# Patient Record
Sex: Female | Born: 1980 | Race: Black or African American | Hispanic: No | Marital: Single | Smoking: Never smoker
Health system: Southern US, Community
[De-identification: ages and names within clinical notes are randomized; demographics above are authoritative.]

---

## 2013-04-26 DIAGNOSIS — F3289 Other specified depressive episodes: Secondary | ICD-10-CM | POA: Diagnosis not present

## 2013-04-26 DIAGNOSIS — R45851 Suicidal ideations: Secondary | ICD-10-CM | POA: Diagnosis not present

## 2013-04-26 DIAGNOSIS — F329 Major depressive disorder, single episode, unspecified: Secondary | ICD-10-CM | POA: Diagnosis not present

## 2013-04-28 DIAGNOSIS — Z91199 Patient's noncompliance with other medical treatment and regimen due to unspecified reason: Secondary | ICD-10-CM | POA: Diagnosis not present

## 2013-04-28 DIAGNOSIS — F200 Paranoid schizophrenia: Secondary | ICD-10-CM | POA: Diagnosis present

## 2013-04-28 DIAGNOSIS — F29 Unspecified psychosis not due to a substance or known physiological condition: Secondary | ICD-10-CM | POA: Diagnosis not present

## 2013-04-28 DIAGNOSIS — K089 Disorder of teeth and supporting structures, unspecified: Secondary | ICD-10-CM | POA: Diagnosis present

## 2013-04-28 DIAGNOSIS — R45851 Suicidal ideations: Secondary | ICD-10-CM | POA: Diagnosis not present

## 2013-04-28 DIAGNOSIS — Z9119 Patient's noncompliance with other medical treatment and regimen: Secondary | ICD-10-CM | POA: Diagnosis not present

## 2013-04-28 DIAGNOSIS — F329 Major depressive disorder, single episode, unspecified: Secondary | ICD-10-CM | POA: Diagnosis not present

## 2013-04-28 DIAGNOSIS — M549 Dorsalgia, unspecified: Secondary | ICD-10-CM | POA: Diagnosis not present

## 2013-06-10 DIAGNOSIS — Z0000 Encounter for general adult medical examination without abnormal findings: Secondary | ICD-10-CM | POA: Diagnosis not present

## 2013-06-10 DIAGNOSIS — Z5320 Procedure and treatment not carried out because of patient's decision for unspecified reasons: Secondary | ICD-10-CM | POA: Diagnosis not present

## 2013-07-11 DIAGNOSIS — F29 Unspecified psychosis not due to a substance or known physiological condition: Secondary | ICD-10-CM | POA: Diagnosis not present

## 2013-07-11 DIAGNOSIS — F489 Nonpsychotic mental disorder, unspecified: Secondary | ICD-10-CM | POA: Diagnosis not present

## 2013-07-12 DIAGNOSIS — Z5900 Homelessness unspecified: Secondary | ICD-10-CM | POA: Diagnosis not present

## 2013-07-12 DIAGNOSIS — F29 Unspecified psychosis not due to a substance or known physiological condition: Secondary | ICD-10-CM | POA: Diagnosis not present

## 2013-07-12 DIAGNOSIS — F209 Schizophrenia, unspecified: Secondary | ICD-10-CM | POA: Diagnosis not present

## 2013-07-12 DIAGNOSIS — F259 Schizoaffective disorder, unspecified: Secondary | ICD-10-CM | POA: Diagnosis not present

## 2013-07-12 DIAGNOSIS — Z91199 Patient's noncompliance with other medical treatment and regimen due to unspecified reason: Secondary | ICD-10-CM | POA: Diagnosis not present

## 2013-07-12 DIAGNOSIS — F609 Personality disorder, unspecified: Secondary | ICD-10-CM | POA: Diagnosis not present

## 2013-07-12 DIAGNOSIS — F200 Paranoid schizophrenia: Secondary | ICD-10-CM | POA: Diagnosis not present

## 2013-07-12 DIAGNOSIS — Z9119 Patient's noncompliance with other medical treatment and regimen: Secondary | ICD-10-CM | POA: Diagnosis not present

## 2013-10-30 DIAGNOSIS — R109 Unspecified abdominal pain: Secondary | ICD-10-CM | POA: Diagnosis not present

## 2014-02-10 DIAGNOSIS — F23 Brief psychotic disorder: Secondary | ICD-10-CM | POA: Diagnosis not present

## 2014-02-10 DIAGNOSIS — R7989 Other specified abnormal findings of blood chemistry: Secondary | ICD-10-CM | POA: Diagnosis not present

## 2014-02-12 DIAGNOSIS — F29 Unspecified psychosis not due to a substance or known physiological condition: Secondary | ICD-10-CM | POA: Diagnosis not present

## 2014-02-13 DIAGNOSIS — F29 Unspecified psychosis not due to a substance or known physiological condition: Secondary | ICD-10-CM | POA: Diagnosis not present

## 2014-02-14 DIAGNOSIS — F29 Unspecified psychosis not due to a substance or known physiological condition: Secondary | ICD-10-CM | POA: Diagnosis not present

## 2014-03-27 DIAGNOSIS — N760 Acute vaginitis: Secondary | ICD-10-CM | POA: Diagnosis not present

## 2014-03-27 DIAGNOSIS — Z720 Tobacco use: Secondary | ICD-10-CM | POA: Diagnosis not present

## 2014-03-27 DIAGNOSIS — N739 Female pelvic inflammatory disease, unspecified: Secondary | ICD-10-CM | POA: Diagnosis not present

## 2014-03-27 DIAGNOSIS — A599 Trichomoniasis, unspecified: Secondary | ICD-10-CM | POA: Diagnosis not present

## 2014-03-27 DIAGNOSIS — A5901 Trichomonal vulvovaginitis: Secondary | ICD-10-CM | POA: Diagnosis not present

## 2014-03-27 DIAGNOSIS — F209 Schizophrenia, unspecified: Secondary | ICD-10-CM | POA: Diagnosis not present

## 2014-03-27 DIAGNOSIS — B9689 Other specified bacterial agents as the cause of diseases classified elsewhere: Secondary | ICD-10-CM | POA: Diagnosis not present

## 2014-07-11 DIAGNOSIS — A64 Unspecified sexually transmitted disease: Secondary | ICD-10-CM | POA: Diagnosis not present

## 2014-07-11 DIAGNOSIS — A599 Trichomoniasis, unspecified: Secondary | ICD-10-CM | POA: Diagnosis not present

## 2014-07-11 DIAGNOSIS — F209 Schizophrenia, unspecified: Secondary | ICD-10-CM | POA: Diagnosis not present

## 2014-12-24 DIAGNOSIS — Z590 Homelessness: Secondary | ICD-10-CM | POA: Diagnosis not present

## 2014-12-24 DIAGNOSIS — F17210 Nicotine dependence, cigarettes, uncomplicated: Secondary | ICD-10-CM | POA: Diagnosis not present

## 2014-12-24 DIAGNOSIS — N390 Urinary tract infection, site not specified: Secondary | ICD-10-CM | POA: Diagnosis not present

## 2015-01-29 DIAGNOSIS — S42032A Displaced fracture of lateral end of left clavicle, initial encounter for closed fracture: Secondary | ICD-10-CM | POA: Diagnosis not present

## 2015-01-29 DIAGNOSIS — A549 Gonococcal infection, unspecified: Secondary | ICD-10-CM | POA: Diagnosis not present

## 2015-01-29 DIAGNOSIS — Z590 Homelessness: Secondary | ICD-10-CM | POA: Diagnosis not present

## 2015-01-29 DIAGNOSIS — F17200 Nicotine dependence, unspecified, uncomplicated: Secondary | ICD-10-CM | POA: Diagnosis not present

## 2015-04-07 DIAGNOSIS — F068 Other specified mental disorders due to known physiological condition: Secondary | ICD-10-CM | POA: Diagnosis not present

## 2015-05-20 DIAGNOSIS — Z30013 Encounter for initial prescription of injectable contraceptive: Secondary | ICD-10-CM | POA: Diagnosis not present

## 2015-06-21 DIAGNOSIS — R69 Illness, unspecified: Secondary | ICD-10-CM | POA: Diagnosis not present

## 2015-06-21 DIAGNOSIS — F329 Major depressive disorder, single episode, unspecified: Secondary | ICD-10-CM | POA: Diagnosis not present

## 2015-06-21 DIAGNOSIS — M25512 Pain in left shoulder: Secondary | ICD-10-CM | POA: Diagnosis not present

## 2015-06-21 DIAGNOSIS — S42032A Displaced fracture of lateral end of left clavicle, initial encounter for closed fracture: Secondary | ICD-10-CM | POA: Diagnosis not present

## 2015-06-21 DIAGNOSIS — Z590 Homelessness: Secondary | ICD-10-CM | POA: Diagnosis not present

## 2015-06-21 DIAGNOSIS — F17210 Nicotine dependence, cigarettes, uncomplicated: Secondary | ICD-10-CM | POA: Diagnosis not present

## 2015-06-21 DIAGNOSIS — Z91410 Personal history of adult physical and sexual abuse: Secondary | ICD-10-CM | POA: Diagnosis not present

## 2015-06-21 DIAGNOSIS — T7691XA Unspecified adult maltreatment, suspected, initial encounter: Secondary | ICD-10-CM | POA: Diagnosis not present

## 2015-06-21 DIAGNOSIS — S42002S Fracture of unspecified part of left clavicle, sequela: Secondary | ICD-10-CM | POA: Diagnosis not present

## 2015-06-21 DIAGNOSIS — F28 Other psychotic disorder not due to a substance or known physiological condition: Secondary | ICD-10-CM | POA: Diagnosis not present

## 2015-06-25 DIAGNOSIS — S42025A Nondisplaced fracture of shaft of left clavicle, initial encounter for closed fracture: Secondary | ICD-10-CM | POA: Diagnosis not present

## 2015-06-27 DIAGNOSIS — S42002A Fracture of unspecified part of left clavicle, initial encounter for closed fracture: Secondary | ICD-10-CM | POA: Diagnosis not present

## 2015-06-27 DIAGNOSIS — Z3202 Encounter for pregnancy test, result negative: Secondary | ICD-10-CM | POA: Diagnosis not present

## 2015-06-27 DIAGNOSIS — S90811A Abrasion, right foot, initial encounter: Secondary | ICD-10-CM | POA: Diagnosis not present

## 2015-06-27 DIAGNOSIS — M25572 Pain in left ankle and joints of left foot: Secondary | ICD-10-CM | POA: Diagnosis not present

## 2015-06-27 DIAGNOSIS — M25571 Pain in right ankle and joints of right foot: Secondary | ICD-10-CM | POA: Diagnosis not present

## 2015-06-27 DIAGNOSIS — F17210 Nicotine dependence, cigarettes, uncomplicated: Secondary | ICD-10-CM | POA: Diagnosis not present

## 2015-06-27 DIAGNOSIS — G8911 Acute pain due to trauma: Secondary | ICD-10-CM | POA: Diagnosis not present

## 2015-06-27 DIAGNOSIS — T148 Other injury of unspecified body region: Secondary | ICD-10-CM | POA: Diagnosis not present

## 2015-06-27 DIAGNOSIS — M25562 Pain in left knee: Secondary | ICD-10-CM | POA: Diagnosis not present

## 2015-06-27 DIAGNOSIS — R918 Other nonspecific abnormal finding of lung field: Secondary | ICD-10-CM | POA: Diagnosis not present

## 2015-06-27 DIAGNOSIS — S99921A Unspecified injury of right foot, initial encounter: Secondary | ICD-10-CM | POA: Diagnosis not present

## 2015-06-27 DIAGNOSIS — S80211A Abrasion, right knee, initial encounter: Secondary | ICD-10-CM | POA: Diagnosis not present

## 2015-06-27 DIAGNOSIS — Z041 Encounter for examination and observation following transport accident: Secondary | ICD-10-CM | POA: Diagnosis not present

## 2015-06-27 DIAGNOSIS — M25561 Pain in right knee: Secondary | ICD-10-CM | POA: Diagnosis not present

## 2015-06-27 DIAGNOSIS — M25551 Pain in right hip: Secondary | ICD-10-CM | POA: Diagnosis not present

## 2015-06-27 DIAGNOSIS — M79672 Pain in left foot: Secondary | ICD-10-CM | POA: Diagnosis not present

## 2015-06-27 DIAGNOSIS — M79671 Pain in right foot: Secondary | ICD-10-CM | POA: Diagnosis not present

## 2015-06-28 DIAGNOSIS — R109 Unspecified abdominal pain: Secondary | ICD-10-CM | POA: Diagnosis not present

## 2015-06-28 DIAGNOSIS — F329 Major depressive disorder, single episode, unspecified: Secondary | ICD-10-CM | POA: Diagnosis not present

## 2015-06-28 DIAGNOSIS — F17210 Nicotine dependence, cigarettes, uncomplicated: Secondary | ICD-10-CM | POA: Diagnosis not present

## 2015-06-28 DIAGNOSIS — R1084 Generalized abdominal pain: Secondary | ICD-10-CM | POA: Diagnosis not present

## 2015-06-28 DIAGNOSIS — M7989 Other specified soft tissue disorders: Secondary | ICD-10-CM | POA: Diagnosis not present

## 2015-06-28 DIAGNOSIS — R071 Chest pain on breathing: Secondary | ICD-10-CM | POA: Diagnosis not present

## 2015-06-30 DIAGNOSIS — Z041 Encounter for examination and observation following transport accident: Secondary | ICD-10-CM | POA: Diagnosis not present

## 2015-06-30 DIAGNOSIS — M791 Myalgia: Secondary | ICD-10-CM | POA: Diagnosis not present

## 2015-06-30 DIAGNOSIS — R52 Pain, unspecified: Secondary | ICD-10-CM | POA: Diagnosis not present

## 2015-06-30 DIAGNOSIS — F17210 Nicotine dependence, cigarettes, uncomplicated: Secondary | ICD-10-CM | POA: Diagnosis not present

## 2015-07-04 DIAGNOSIS — Z8659 Personal history of other mental and behavioral disorders: Secondary | ICD-10-CM | POA: Diagnosis not present

## 2015-07-04 DIAGNOSIS — M79604 Pain in right leg: Secondary | ICD-10-CM | POA: Diagnosis not present

## 2015-07-04 DIAGNOSIS — M79605 Pain in left leg: Secondary | ICD-10-CM | POA: Diagnosis not present

## 2015-07-04 DIAGNOSIS — F17210 Nicotine dependence, cigarettes, uncomplicated: Secondary | ICD-10-CM | POA: Diagnosis not present

## 2015-09-23 DIAGNOSIS — F411 Generalized anxiety disorder: Secondary | ICD-10-CM | POA: Diagnosis not present

## 2015-09-23 DIAGNOSIS — F209 Schizophrenia, unspecified: Secondary | ICD-10-CM | POA: Diagnosis not present

## 2015-09-23 DIAGNOSIS — F339 Major depressive disorder, recurrent, unspecified: Secondary | ICD-10-CM | POA: Diagnosis not present

## 2015-09-23 DIAGNOSIS — M545 Low back pain: Secondary | ICD-10-CM | POA: Diagnosis not present

## 2015-09-23 DIAGNOSIS — G8929 Other chronic pain: Secondary | ICD-10-CM | POA: Diagnosis not present

## 2015-09-23 DIAGNOSIS — G4700 Insomnia, unspecified: Secondary | ICD-10-CM | POA: Diagnosis not present

## 2015-09-23 DIAGNOSIS — Z3042 Encounter for surveillance of injectable contraceptive: Secondary | ICD-10-CM | POA: Diagnosis not present

## 2015-09-23 DIAGNOSIS — M62830 Muscle spasm of back: Secondary | ICD-10-CM | POA: Diagnosis not present

## 2015-10-10 DIAGNOSIS — M545 Low back pain: Secondary | ICD-10-CM | POA: Diagnosis not present

## 2015-10-10 DIAGNOSIS — M79606 Pain in leg, unspecified: Secondary | ICD-10-CM | POA: Diagnosis not present

## 2015-10-10 DIAGNOSIS — Z79899 Other long term (current) drug therapy: Secondary | ICD-10-CM | POA: Diagnosis not present

## 2015-10-10 DIAGNOSIS — M25512 Pain in left shoulder: Secondary | ICD-10-CM | POA: Diagnosis not present

## 2015-10-10 DIAGNOSIS — R42 Dizziness and giddiness: Secondary | ICD-10-CM | POA: Diagnosis not present

## 2015-10-10 DIAGNOSIS — M542 Cervicalgia: Secondary | ICD-10-CM | POA: Diagnosis not present

## 2015-10-10 DIAGNOSIS — G8929 Other chronic pain: Secondary | ICD-10-CM | POA: Diagnosis not present

## 2015-10-10 DIAGNOSIS — M7989 Other specified soft tissue disorders: Secondary | ICD-10-CM | POA: Diagnosis not present

## 2016-02-13 DIAGNOSIS — Z3040 Encounter for surveillance of contraceptives, unspecified: Secondary | ICD-10-CM | POA: Diagnosis not present

## 2016-02-13 DIAGNOSIS — M545 Low back pain: Secondary | ICD-10-CM | POA: Diagnosis not present

## 2016-02-13 DIAGNOSIS — M62830 Muscle spasm of back: Secondary | ICD-10-CM | POA: Diagnosis not present

## 2016-02-13 DIAGNOSIS — G8929 Other chronic pain: Secondary | ICD-10-CM | POA: Diagnosis not present

## 2016-02-13 DIAGNOSIS — G4700 Insomnia, unspecified: Secondary | ICD-10-CM | POA: Diagnosis not present

## 2016-02-13 DIAGNOSIS — F339 Major depressive disorder, recurrent, unspecified: Secondary | ICD-10-CM | POA: Diagnosis not present

## 2016-02-13 DIAGNOSIS — F411 Generalized anxiety disorder: Secondary | ICD-10-CM | POA: Diagnosis not present

## 2016-02-13 DIAGNOSIS — F209 Schizophrenia, unspecified: Secondary | ICD-10-CM | POA: Diagnosis not present

## 2016-06-21 ENCOUNTER — Emergency Department (HOSPITAL_COMMUNITY)
Admission: EM | Admit: 2016-06-21 | Discharge: 2016-06-21 | Disposition: A | Discharge status: Other Acute Inpatient Hospital | Payer: Medicaid Other | Attending: Emergency Medicine | Admitting: Emergency Medicine

## 2016-06-21 ENCOUNTER — Inpatient Hospital Stay (HOSPITAL_COMMUNITY)
Admission: AD | Admit: 2016-06-21 | Discharge: 2016-06-24 | DRG: 885 | Disposition: A | Discharge status: Home / Self Care | Payer: Medicaid Other | Source: Intra-hospital | Attending: Psychiatry | Admitting: Psychiatry

## 2016-06-21 ENCOUNTER — Encounter (HOSPITAL_COMMUNITY): Payer: Self-pay | Admitting: *Deleted

## 2016-06-21 ENCOUNTER — Encounter (HOSPITAL_COMMUNITY): Payer: Self-pay

## 2016-06-21 ENCOUNTER — Emergency Department (HOSPITAL_COMMUNITY): Payer: Medicaid Other

## 2016-06-21 DIAGNOSIS — R0781 Pleurodynia: Secondary | ICD-10-CM | POA: Insufficient documentation

## 2016-06-21 DIAGNOSIS — F1094 Alcohol use, unspecified with alcohol-induced mood disorder: Secondary | ICD-10-CM

## 2016-06-21 DIAGNOSIS — F4310 Post-traumatic stress disorder, unspecified: Secondary | ICD-10-CM | POA: Diagnosis present

## 2016-06-21 DIAGNOSIS — F1014 Alcohol abuse with alcohol-induced mood disorder: Secondary | ICD-10-CM | POA: Insufficient documentation

## 2016-06-21 DIAGNOSIS — Z79899 Other long term (current) drug therapy: Secondary | ICD-10-CM

## 2016-06-21 DIAGNOSIS — T1490XA Injury, unspecified, initial encounter: Secondary | ICD-10-CM | POA: Diagnosis present

## 2016-06-21 DIAGNOSIS — Y999 Unspecified external cause status: Secondary | ICD-10-CM | POA: Insufficient documentation

## 2016-06-21 DIAGNOSIS — Y906 Blood alcohol level of 120-199 mg/100 ml: Secondary | ICD-10-CM | POA: Diagnosis present

## 2016-06-21 DIAGNOSIS — F1410 Cocaine abuse, uncomplicated: Secondary | ICD-10-CM | POA: Diagnosis present

## 2016-06-21 DIAGNOSIS — R45851 Suicidal ideations: Secondary | ICD-10-CM | POA: Diagnosis present

## 2016-06-21 DIAGNOSIS — Y939 Activity, unspecified: Secondary | ICD-10-CM | POA: Insufficient documentation

## 2016-06-21 DIAGNOSIS — F17200 Nicotine dependence, unspecified, uncomplicated: Secondary | ICD-10-CM | POA: Diagnosis present

## 2016-06-21 DIAGNOSIS — J189 Pneumonia, unspecified organism: Secondary | ICD-10-CM | POA: Diagnosis present

## 2016-06-21 DIAGNOSIS — F314 Bipolar disorder, current episode depressed, severe, without psychotic features: Secondary | ICD-10-CM | POA: Diagnosis not present

## 2016-06-21 DIAGNOSIS — R45850 Homicidal ideations: Secondary | ICD-10-CM | POA: Diagnosis present

## 2016-06-21 DIAGNOSIS — Z23 Encounter for immunization: Secondary | ICD-10-CM

## 2016-06-21 DIAGNOSIS — I10 Essential (primary) hypertension: Secondary | ICD-10-CM | POA: Diagnosis present

## 2016-06-21 DIAGNOSIS — Y929 Unspecified place or not applicable: Secondary | ICD-10-CM | POA: Insufficient documentation

## 2016-06-21 DIAGNOSIS — R079 Chest pain, unspecified: Secondary | ICD-10-CM | POA: Diagnosis not present

## 2016-06-21 DIAGNOSIS — F332 Major depressive disorder, recurrent severe without psychotic features: Principal | ICD-10-CM | POA: Diagnosis present

## 2016-06-21 DIAGNOSIS — Z5900 Homelessness unspecified: Secondary | ICD-10-CM

## 2016-06-21 DIAGNOSIS — Z5181 Encounter for therapeutic drug level monitoring: Secondary | ICD-10-CM | POA: Diagnosis not present

## 2016-06-21 DIAGNOSIS — G4700 Insomnia, unspecified: Secondary | ICD-10-CM | POA: Diagnosis present

## 2016-06-21 DIAGNOSIS — F1020 Alcohol dependence, uncomplicated: Secondary | ICD-10-CM | POA: Diagnosis present

## 2016-06-21 LAB — COMPREHENSIVE METABOLIC PANEL
ALBUMIN: 4.3 g/dL (ref 3.5–5.0)
ALK PHOS: 85 U/L (ref 38–126)
ALT: 22 U/L (ref 14–54)
AST: 41 U/L (ref 15–41)
Anion gap: 12 (ref 5–15)
BILIRUBIN TOTAL: 0.4 mg/dL (ref 0.3–1.2)
BUN: 7 mg/dL (ref 6–20)
CALCIUM: 9.5 mg/dL (ref 8.9–10.3)
CO2: 26 mmol/L (ref 22–32)
Chloride: 102 mmol/L (ref 101–111)
Creatinine, Ser: 0.96 mg/dL (ref 0.44–1.00)
GFR calc Af Amer: >60 mL/min (ref >60)
GFR calc non Af Amer: >60 mL/min (ref >60)
GLUCOSE: 81 mg/dL (ref 65–99)
Potassium: 4.3 mmol/L (ref 3.5–5.1)
Sodium: 140 mmol/L (ref 135–145)
TOTAL PROTEIN: 7.7 g/dL (ref 6.5–8.1)

## 2016-06-21 LAB — CBC
HCT: 37.7 % (ref 36.0–46.0)
HEMOGLOBIN: 12.5 g/dL (ref 12.0–15.0)
MCH: 26.8 pg (ref 26.0–34.0)
MCHC: 33.2 g/dL (ref 30.0–36.0)
MCV: 80.7 fL (ref 78.0–100.0)
Platelets: 310 10*3/uL (ref 150–400)
RBC: 4.67 MIL/uL (ref 3.87–5.11)
RDW: 14.4 % (ref 11.5–15.5)
WBC: 11.5 10*3/uL — ABNORMAL HIGH (ref 4.0–10.5)

## 2016-06-21 LAB — RAPID URINE DRUG SCREEN, HOSP PERFORMED
Amphetamines: NOT DETECTED
BARBITURATES: NOT DETECTED
Benzodiazepines: NOT DETECTED
Cocaine: POSITIVE — AB
Opiates: NOT DETECTED
Tetrahydrocannabinol: NOT DETECTED

## 2016-06-21 LAB — ETHANOL: Alcohol, Ethyl (B): 137 mg/dL — ABNORMAL HIGH (ref <5)

## 2016-06-21 LAB — PREGNANCY, URINE: PREG TEST UR: NEGATIVE

## 2016-06-21 MED ORDER — IBUPROFEN 800 MG PO TABS
800.0000 mg | ORAL_TABLET | Freq: Three times a day (TID) | ORAL | Status: DC | PRN
  Administered 2016-06-22 – 2016-06-24 (×5): 800 mg via ORAL
  Filled 2016-06-21 (×5): qty 1

## 2016-06-21 MED ORDER — LORAZEPAM 1 MG PO TABS
0.0000 mg | ORAL_TABLET | Freq: Two times a day (BID) | ORAL | Status: DC
Start: 2016-06-23 — End: 2016-06-21

## 2016-06-21 MED ORDER — LORAZEPAM 1 MG PO TABS
1.0000 mg | ORAL_TABLET | Freq: Four times a day (QID) | ORAL | Status: AC
  Administered 2016-06-21 – 2016-06-22 (×4): 1 mg via ORAL
  Filled 2016-06-21 (×3): qty 1

## 2016-06-21 MED ORDER — LEVOFLOXACIN 250 MG PO TABS
750.0000 mg | ORAL_TABLET | Freq: Every day | ORAL | Status: DC
  Administered 2016-06-22 – 2016-06-24 (×3): 750 mg via ORAL
  Filled 2016-06-21 (×4): qty 1

## 2016-06-21 MED ORDER — IBUPROFEN 400 MG PO TABS
800.0000 mg | ORAL_TABLET | Freq: Three times a day (TID) | ORAL | Status: DC | PRN
  Administered 2016-06-21 (×2): 800 mg via ORAL
  Filled 2016-06-21 (×2): qty 1

## 2016-06-21 MED ORDER — LORAZEPAM 1 MG PO TABS
1.0000 mg | ORAL_TABLET | Freq: Two times a day (BID) | ORAL | Status: DC
  Administered 2016-06-24: 1 mg via ORAL
  Filled 2016-06-21: qty 1

## 2016-06-21 MED ORDER — CITALOPRAM HYDROBROMIDE 20 MG PO TABS
ORAL_TABLET | ORAL | Status: AC
  Administered 2016-06-21: 20 mg via ORAL
  Filled 2016-06-21: qty 1

## 2016-06-21 MED ORDER — LORAZEPAM 1 MG PO TABS
1.0000 mg | ORAL_TABLET | Freq: Three times a day (TID) | ORAL | Status: AC
  Administered 2016-06-22 – 2016-06-23 (×3): 1 mg via ORAL
  Filled 2016-06-21 (×3): qty 1

## 2016-06-21 MED ORDER — CITALOPRAM HYDROBROMIDE 20 MG PO TABS
20.0000 mg | ORAL_TABLET | Freq: Every day | ORAL | Status: DC
  Administered 2016-06-21 – 2016-06-24 (×4): 20 mg via ORAL
  Filled 2016-06-21 (×7): qty 1

## 2016-06-21 MED ORDER — QUETIAPINE FUMARATE 50 MG PO TABS
50.0000 mg | ORAL_TABLET | Freq: Every day | ORAL | Status: DC
  Administered 2016-06-21 – 2016-06-23 (×3): 50 mg via ORAL
  Filled 2016-06-21 (×7): qty 1

## 2016-06-21 MED ORDER — VITAMIN B-1 100 MG PO TABS
ORAL_TABLET | ORAL | Status: AC
  Administered 2016-06-21: 100 mg via ORAL
  Filled 2016-06-21: qty 1

## 2016-06-21 MED ORDER — LEVOFLOXACIN 750 MG PO TABS
750.0000 mg | ORAL_TABLET | Freq: Every day | ORAL | Status: DC
  Administered 2016-06-21: 750 mg via ORAL
  Filled 2016-06-21: qty 1

## 2016-06-21 MED ORDER — ADULT MULTIVITAMIN W/MINERALS CH
1.0000 | ORAL_TABLET | Freq: Every day | ORAL | Status: DC
  Administered 2016-06-21 – 2016-06-24 (×4): 1 via ORAL
  Filled 2016-06-21 (×7): qty 1

## 2016-06-21 MED ORDER — VITAMIN B-1 100 MG PO TABS
100.0000 mg | ORAL_TABLET | Freq: Every day | ORAL | Status: DC
  Administered 2016-06-21 – 2016-06-24 (×4): 100 mg via ORAL
  Filled 2016-06-21 (×6): qty 1

## 2016-06-21 MED ORDER — ALUM & MAG HYDROXIDE-SIMETH 200-200-20 MG/5ML PO SUSP: 30.0000 mL | ORAL | Status: DC | PRN

## 2016-06-21 MED ORDER — MAGNESIUM HYDROXIDE 400 MG/5ML PO SUSP: 30.0000 mL | Freq: Every day | ORAL | Status: DC | PRN

## 2016-06-21 MED ORDER — ADULT MULTIVITAMIN W/MINERALS CH
ORAL_TABLET | ORAL | Status: AC
  Administered 2016-06-21: 1 via ORAL
  Filled 2016-06-21: qty 1

## 2016-06-21 MED ORDER — HYDROXYZINE HCL 50 MG PO TABS
50.0000 mg | ORAL_TABLET | Freq: Three times a day (TID) | ORAL | Status: DC | PRN
  Administered 2016-06-23 – 2016-06-24 (×2): 50 mg via ORAL
  Filled 2016-06-21 (×2): qty 1

## 2016-06-21 MED ORDER — ONDANSETRON 4 MG PO TBDP: 4.0000 mg | ORAL_TABLET | Freq: Four times a day (QID) | ORAL | Status: DC | PRN

## 2016-06-21 MED ORDER — LORAZEPAM 1 MG PO TABS
ORAL_TABLET | ORAL | Status: AC
  Administered 2016-06-21: 1 mg via ORAL
  Filled 2016-06-21: qty 1

## 2016-06-21 MED ORDER — LORAZEPAM 1 MG PO TABS: 0.0000 mg | ORAL_TABLET | Freq: Four times a day (QID) | ORAL | Status: DC

## 2016-06-21 MED ORDER — LORAZEPAM 1 MG PO TABS: 1.0000 mg | ORAL_TABLET | Freq: Every day | ORAL | Status: DC

## 2016-06-21 MED ORDER — ACETAMINOPHEN 325 MG PO TABS
650.0000 mg | ORAL_TABLET | Freq: Four times a day (QID) | ORAL | Status: DC | PRN
  Administered 2016-06-22 – 2016-06-23 (×2): 650 mg via ORAL
  Filled 2016-06-21 (×2): qty 2

## 2016-06-21 MED ORDER — LORAZEPAM 1 MG PO TABS: 1.0000 mg | ORAL_TABLET | Freq: Four times a day (QID) | ORAL | Status: DC | PRN

## 2016-06-21 MED ORDER — TRAZODONE HCL 50 MG PO TABS
50.0000 mg | ORAL_TABLET | Freq: Every day | ORAL | Status: DC
  Administered 2016-06-21 – 2016-06-23 (×3): 50 mg via ORAL
  Filled 2016-06-21 (×6): qty 1

## 2016-06-21 MED ORDER — LOPERAMIDE HCL 2 MG PO CAPS: 2.0000 mg | ORAL_CAPSULE | ORAL | Status: DC | PRN

## 2016-06-21 MED ORDER — THIAMINE HCL 100 MG/ML IJ SOLN: 100.0000 mg | Freq: Once | INTRAMUSCULAR | Status: DC

## 2016-06-21 NOTE — ED Notes (Signed)
Lunch tray ordered 

## 2016-06-21 NOTE — ED Notes (Signed)
Report and pelum for transport called.

## 2016-06-21 NOTE — Progress Notes (Signed)
D.  Pt pleasant on approach, new admission to unit.  Pt did not get up for evening AA group tonight.  Pt observed interacting appropriately with peers on the unit.  Pt denies SI/HI/hallucinations at this time.  A.  Support and encouragement offered, medication given as ordered  R.  Pt remains safe on the unit.  Will continue to monitor.

## 2016-06-21 NOTE — ED Notes (Signed)
Patient transported to X-ray 
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
## 2016-06-21 NOTE — ED Notes (Signed)
Pt changed into purple scrubs and given saltines and ginger ale. Pt's belongings taken to nurses station.

## 2016-06-21 NOTE — ED Notes (Signed)
Pt given breakfast tray

## 2016-06-21 NOTE — ED Notes (Signed)
TTS at bedside. 

## 2016-06-21 NOTE — ED Notes (Signed)
Pt in gown, and given warm blankets. Pt asking this EMT about the Psychiatric process and where she could be transferred to.

## 2016-06-21 NOTE — Progress Notes (Signed)
Patient ID: Barbara Werner, female   DOB: 05-19-1980, 36 y.o.   MRN: [id number] Pt did not attended evening AA group.

## 2016-06-21 NOTE — Tx Team (Signed)
Initial Treatment Plan 06/21/2016 5:06 PM Barbara LienSylvia Fogarty AOZ:[id number]    PATIENT STRESSORS: Financial difficulties Marital or family conflict   PATIENT STRENGTHS: Average or above average intelligence General fund of knowledge   PATIENT IDENTIFIED PROBLEMS: Depression    "I just want my kids back"    "I need to get my own place"    SI         DISCHARGE CRITERIA:  Improved stabilization in mood, thinking, and/or behavior Need for constant or close observation no longer present  PRELIMINARY DISCHARGE PLAN: Return to previous living arrangement  PATIENT/FAMILY INVOLVEMENT: This treatment plan has been presented to and reviewed with the patient, Barbara Werner, and/or family member.  The patient and family have been given the opportunity to ask questions and make suggestions.  Buford DresserForrest, Tyrika Newman Shanta, RN 06/21/2016, 5:06 PM

## 2016-06-21 NOTE — ED Notes (Signed)
Pt wanded by security. 

## 2016-06-21 NOTE — ED Provider Notes (Signed)
Questionable bilateral pneumonia on x-ray. Mild elevation of white blood cell count. Patient states she has had a productive cough recently. Patient denies any IV drug abuse. Given dose of Levaquin in the emergency department. We'll need to continue and have repeat chest x-ray. Patient is medically cleared for psychiatric transfer. Vital signs remain stable.   Loren Raceravid Meganne Rita, MD 06/21/16 1332

## 2016-06-21 NOTE — ED Notes (Signed)
Pt left with pellam to behavirial

## 2016-06-21 NOTE — ED Notes (Signed)
Pt did not need anything at this time  
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
## 2016-06-21 NOTE — ED Notes (Signed)
TTS being completed. 
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
## 2016-06-21 NOTE — Plan of Care (Signed)
Problem: Medication: Goal: Compliance with prescribed medication regimen will improve Outcome: Progressing Pt is compliant with medication regimen   

## 2016-06-21 NOTE — BH Assessment (Signed)
[facility]   06/21/16: Patient meets inpatient criteria per Arville Care[facility]ks NP and has been accepted to 300-2 at 14:30.

## 2016-06-22 ENCOUNTER — Encounter (HOSPITAL_COMMUNITY): Payer: Self-pay | Admitting: Psychiatry

## 2016-06-22 DIAGNOSIS — F314 Bipolar disorder, current episode depressed, severe, without psychotic features: Secondary | ICD-10-CM

## 2016-06-22 DIAGNOSIS — Z79899 Other long term (current) drug therapy: Secondary | ICD-10-CM

## 2016-06-22 MED ORDER — METOPROLOL SUCCINATE ER 25 MG PO TB24
25.0000 mg | ORAL_TABLET | Freq: Every day | ORAL | Status: DC
  Administered 2016-06-22 – 2016-06-24 (×3): 25 mg via ORAL
  Filled 2016-06-22 (×6): qty 1

## 2016-06-22 NOTE — Plan of Care (Signed)
Problem: Activity: Goal: Interest or engagement in activities will improve Outcome: Not Progressing Patient is tired from a late night admission.  She is isolative to her room.

## 2016-06-22 NOTE — Progress Notes (Signed)
Patient did not attend wrap up group. 

## 2016-06-22 NOTE — Tx Team (Signed)
Interdisciplinary Treatment and Diagnostic Plan Update  06/22/2016 Time of Session: 0930 Barbara Werner MRN: [id number]  Principal Diagnosis: Bipolar Disorder I Secondary Diagnoses: Active Problems:   Bipolar 1 disorder, depressed, severe (HCC)   Current Medications:  Current Facility-Administered Medications  Medication Dose Route Frequency Provider Last Rate Last Dose  . acetaminophen (TYLENOL) tablet 650 mg  650 mg Oral Q6H PRN Laveda Abbe, NP      . alum & mag hydroxide-simeth (MAALOX/MYLANTA) 200-200-20 MG/5ML suspension 30 mL  30 mL Oral Q4H PRN Laveda Abbe, NP      . citalopram (CELEXA) tablet 20 mg  20 mg Oral Daily Laveda Abbe, NP   20 mg at 06/22/16 4098  . hydrOXYzine (ATARAX/VISTARIL) tablet 50 mg  50 mg Oral TID PRN Laveda Abbe, NP      . ibuprofen (ADVIL,MOTRIN) tablet 800 mg  800 mg Oral Q8H PRN Laveda Abbe, NP      . levofloxacin [facility]) tablet 750 mg  750 mg Oral Daily Laveda Abbe, NP   750 mg at 06/22/16 1191  . loperamide (IMODIUM) capsule 2-4 mg  2-4 mg Oral PRN Laveda Abbe, NP      . LORazepam (ATIVAN) tablet 1 mg  1 mg Oral Q6H PRN Laveda Abbe, NP      . LORazepam (ATIVAN) tablet 1 mg  1 mg Oral QID Laveda Abbe, NP   1 mg at 06/22/16 4782   Followed by  . LORazepam (ATIVAN) tablet 1 mg  1 mg Oral TID Laveda Abbe, NP       Followed by  . [START ON 06/23/2016] LORazepam (ATIVAN) tablet 1 mg  1 mg Oral BID Laveda Abbe, NP       Followed by  . [START ON 06/25/2016] LORazepam (ATIVAN) tablet 1 mg  1 mg Oral Daily Laveda Abbe, NP      . magnesium hydroxide (MILK OF MAGNESIA) suspension 30 mL  30 mL Oral Daily PRN Laveda Abbe, NP      . metoprolol succinate (TOPROL-XL) 24 hr tablet 25 mg  25 mg Oral Daily Antonieta Pert, MD      . multivitamin with minerals tablet 1 tablet  1 tablet Oral Daily Laveda Abbe, NP   1 tablet at 06/22/16 [phone]  .  ondansetron (ZOFRAN-ODT) disintegrating tablet 4 mg  4 mg Oral Q6H PRN Laveda Abbe, NP      . QUEtiapine (SEROQUEL) tablet 50 mg  50 mg Oral QHS Laveda Abbe, NP   50 mg at 06/21/16 2128  . thiamine (B-1) injection 100 mg  100 mg Intramuscular Once Laveda Abbe, NP      . thiamine (VITAMIN B-1) tablet 100 mg  100 mg Oral Daily Laveda Abbe, NP   100 mg at 06/22/16 1308  . traZODone (DESYREL) tablet 50 mg  50 mg Oral QHS Laveda Abbe, NP   50 mg at 06/21/16 2128   PTA Medications: Prescriptions Prior to Admission  Medication Sig Dispense Refill Last Dose  . citalopram (CELEXA) 20 MG tablet Take 20 mg by mouth daily.   Unknown at Unknown time  . hydrOXYzine (VISTARIL) 50 MG capsule Take 50 mg by mouth 3 (three) times daily as needed for anxiety.   Unknown at Unknown time  . QUEtiapine (SEROQUEL) 50 MG tablet Take 50 mg by mouth at bedtime.   Unknown at Unknown time  . traZODone (DESYREL) 50 MG tablet  Take 50 mg by mouth at bedtime.   Unknown at Unknown time    Patient Stressors: Financial difficulties Marital or family conflict  Patient Strengths: Average or above average intelligence General fund of knowledge  Treatment Modalities: Medication Management, Group therapy, Case management,  1 to 1 session with clinician, Psychoeducation, Recreational therapy.   Physician Treatment Plan for Primary Diagnosis:  Bipolar Disorder I Long Term Goal(s): Improvement in symptoms so as ready for discharge Improvement in symptoms so as ready for discharge   Short Term Goals: Ability to identify changes in lifestyle to reduce recurrence of condition will improve Ability to verbalize feelings will improve Ability to disclose and discuss suicidal ideas Ability to demonstrate self-control will improve Ability to identify and develop effective coping behaviors will improve Ability to maintain clinical measurements within normal limits will improve Compliance  with prescribed medications will improve Ability to identify triggers associated with substance abuse/mental health issues will improve Ability to identify changes in lifestyle to reduce recurrence of condition will improve Ability to verbalize feelings will improve Ability to disclose and discuss suicidal ideas Ability to demonstrate self-control will improve Ability to identify and develop effective coping behaviors will improve Ability to maintain clinical measurements within normal limits will improve Compliance with prescribed medications will improve Ability to identify triggers associated with substance abuse/mental health issues will improve  Medication Management: Evaluate patient's response, side effects, and tolerance of medication regimen.  Therapeutic Interventions: 1 to 1 sessions, Unit Group sessions and Medication administration.  Evaluation of Outcomes: Progressing  Physician Treatment Plan for Secondary Diagnosis: Active Problems:   Bipolar 1 disorder, depressed, severe (HCC)  Long Term Goal(s): Improvement in symptoms so as ready for discharge Improvement in symptoms so as ready for discharge   Short Term Goals: Ability to identify changes in lifestyle to reduce recurrence of condition will improve Ability to verbalize feelings will improve Ability to disclose and discuss suicidal ideas Ability to demonstrate self-control will improve Ability to identify and develop effective coping behaviors will improve Ability to maintain clinical measurements within normal limits will improve Compliance with prescribed medications will improve Ability to identify triggers associated with substance abuse/mental health issues will improve Ability to identify changes in lifestyle to reduce recurrence of condition will improve Ability to verbalize feelings will improve Ability to disclose and discuss suicidal ideas Ability to demonstrate self-control will improve Ability to  identify and develop effective coping behaviors will improve Ability to maintain clinical measurements within normal limits will improve Compliance with prescribed medications will improve Ability to identify triggers associated with substance abuse/mental health issues will improve     Medication Management: Evaluate patient's response, side effects, and tolerance of medication regimen.  Therapeutic Interventions: 1 to 1 sessions, Unit Group sessions and Medication administration.  Evaluation of Outcomes: Progressing   RN Treatment Plan for Primary Diagnosis:  Bipolar Disorder I Long Term Goal(s): Knowledge of disease and therapeutic regimen to maintain health will improve  Short Term Goals: Ability to remain free from injury will improve, Ability to disclose and discuss suicidal ideas and Ability to identify and develop effective coping behaviors will improve  Medication Management: RN will administer medications as ordered by provider, will assess and evaluate patient's response and provide education to patient for prescribed medication. RN will report any adverse and/or side effects to prescribing provider.  Therapeutic Interventions: 1 on 1 counseling sessions, Psychoeducation, Medication administration, Evaluate responses to treatment, Monitor vital signs and CBGs as ordered, Perform/monitor CIWA, COWS, AIMS and Fall  Risk screenings as ordered, Perform wound care treatments as ordered.  Evaluation of Outcomes: Progressing   LCSW Treatment Plan for Primary Diagnosis: Bipolar Disorder I Long Term Goal(s): Safe transition to appropriate next level of care at discharge, Engage patient in therapeutic group addressing interpersonal concerns.  Short Term Goals: Engage patient in aftercare planning with referrals and resources, Facilitate patient progression through stages of change regarding substance use diagnoses and concerns and Identify triggers associated with mental health/substance  abuse issues  Therapeutic Interventions: Assess for all discharge needs, 1 to 1 time with Social worker, Explore available resources and support systems, Assess for adequacy in community support network, Educate family and significant other(s) on suicide prevention, Complete Psychosocial Assessment, Interpersonal group therapy.  Evaluation of Outcomes: Progressing   Progress in Treatment: Attending groups: Yes. Participating in groups: Yes. Taking medication as prescribed: Yes. Toleration medication: Yes. Family/Significant other contact made: No, will contact:  family member if patient consents Patient understands diagnosis: Yes. Discussing patient identified problems/goals with staff: No. Medical problems stabilized or resolved: No. Denies suicidal/homicidal ideation: Yes. Issues/concerns per patient self-inventory: No. Other: n/a  New problem(s) identified: No, Describe:  n/a  New Short Term/Long Term Goal(s): detox; medication stabilization; development of comprehensive mental wellness/sobriety plan.   Discharge Plan or Barriers: CSW assessing for appropriate referrals.   Reason for Continuation of Hospitalization: Depression Medication stabilization Withdrawal symptoms  Estimated Length of Stay: 3-5 days   Attendees: Patient: 06/22/2016 10:44 AM  Physician: Dr. Jola Babinskilary MD 06/22/2016 10:44 AM  Nursing: Purvis Kiltsaroline; Patrice RN 06/22/2016 10:44 AM  RN Care Manager: Onnie BoerJennifer Clark CM 06/22/2016 10:44 AM  Social Worker: Herbert SetaHeather Smart, LCSW; Donnelly StagerLynn Bryant LCSW 06/22/2016 10:44 AM  Recreational Therapist: x 06/22/2016 10:44 AM  Other: Gray BernhardtMay Augustin NP; Hillery Jacksanika Lewis NP 06/22/2016 10:44 AM  Other:  06/22/2016 10:44 AM  Other: 06/22/2016 10:44 AM    Scribe for Treatment Team: Ledell PeoplesHeather N Smart, LCSW 06/22/2016 10:44 AM
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
## 2016-06-22 NOTE — [facility] Suicide Risk Assessment (Signed)
[facility]   Nursing information obtained from:  Patient Demographic factors:  Low socioeconomic status Current Mental Status:  Suicidal ideation indicated by patient Loss Factors:  Financial problems / change in socioeconomic status Historical Factors:  Prior suicide attempts Risk Reduction Factors:  Responsible for children under 36 years of age  Total Time spent with patient: 45 minutes Principal Problem:  Diagnosis:   Patient Active Problem List   Diagnosis Date Noted  . MDD (major depressive disorder), recurrent severe, without psychosis (HCC) [F33.2] 06/21/2016  . Bipolar 1 disorder, depressed, severe (HCC) [F31.4] 06/21/2016   Subjective Data: patient is seen and examined. Patient is a 36 YO female with a history of PTSD, depression, bipolar disorder as well as cocaine use disorder admitted due to suicidal and homicidal ideation.  Continued Clinical Symptoms:  Alcohol Use Disorder Identification Test Final Score (AUDIT): 0 The "Alcohol Use Disorders Identification Test", Guidelines for Use in Primary Care, Second Edition.  World Science writerHealth Organization [facility](WHO). Score between 0-7:  no or low risk or alcohol related problems. Score between 8-15:  moderate risk of alcohol related problems. Score between 16-19:  high risk of alcohol related problems. Score 20 or above:  warrants further diagnostic evaluation for alcohol dependence and treatment.   CLINICAL FACTORS:   Bipolar Disorder:   Depressive phase Depression:   Aggression Comorbid alcohol abuse/dependence Hopelessness Impulsivity Alcohol/Substance Abuse/Dependencies Personality Disorders:   Cluster B Comorbid alcohol abuse/dependence Comorbid depression Medical Diagnoses and Treatments/Surgeries   Musculoskeletal: Strength & Muscle Tone: within normal limits Gait & Station: normal Patient leans: Right and N/A  Psychiatric Specialty Exam: Physical Exam  Nursing note and vitals reviewed.    ROS  Blood pressure (!) 115/57, pulse (!) 106, temperature 99 F (37.2 C), temperature source Oral, resp. rate 18, height 5\' 8"  (1.727 m), weight 90.7 kg (200 lb).Body mass index is 30.41 kg/m.  General Appearance: Disheveled  Eye Contact:  Fair  Speech:  Normal Rate  Volume:  Decreased  Mood:  Depressed  Affect:  Appropriate  Thought Process:  Coherent  Orientation:  Full (Time, Place, and Person)  Thought Content:  Logical  Suicidal Thoughts:  No  Homicidal Thoughts:  Yes.  without intent/plan  Memory:  Immediate;   Fair  Judgement:  Fair  Insight:  Lacking  Psychomotor Activity:  Normal  Concentration:  Concentration: Fair  Recall:  [employer]  Fund of Knowledge:  Fair  Language:  Good  Akathisia:  No  Handed:  Right  AIMS (if indicated):     Assets:  Communication Skills Desire for Improvement Resilience  ADL's:  Intact  Cognition:  WNL  Sleep:  Number of Hours: 6.15      COGNITIVE FEATURES THAT CONTRIBUTE TO RISK:  Closed-mindedness and Thought constriction (tunnel vision)    SUICIDE RISK:   Minimal: No identifiable suicidal ideation.  Patients presenting with no risk factors but with morbid ruminations; may be classified as minimal risk based on the severity of the depressive symptoms  PLAN OF CARE: 1) continue celexa and seroquel 2) monitor for withdrawal symptoms 3) treat current pneumonia with levaquin 4) social work assistance with psychosocial issues 5) discussed continued subustance abuse issues  I certify that inpatient services furnished can reasonably be expected to improve the patient's condition.   Antonieta PertGreg Lawson Clary, MD 06/22/2016, 10:15 AM

## 2016-06-22 NOTE — Progress Notes (Signed)
Recreation Therapy Notes  Date: 06/22/16 Time: 0930 Location: [address] Dayroom  Group Topic: Stress Management  Goal Area(s) Addresses:  Patient will verbalize importance of using healthy stress management.  Patient will identify positive emotions associated with healthy stress management.   Intervention: Stress Management  Activity :  Guided Visualization.  LRT introduced the stress management technique of guided visualization.  LRT read a script to allow patient to follow along and engage in the activity.  Patients were to follow along at LRT read script to engage in the activity.  Education:  Stress Management, Discharge Planning.   Education Outcome: Acknowledges edcuation/In group clarification offered/Needs additional education  Clinical Observations/Feedback: Pt did not attend group.    Catori Panozzo, LRT/CTRS         Barbara Werner A 06/22/2016 12:51 PM 

## 2016-06-22 NOTE — [facility] Group Notes (Signed)
[facility] LCSW Group Therapy  06/22/2016 11:32 AM  Type of Therapy:  Group Therapy  Participation Level:  Did Not Attend-pt invited. Chose to remain in bed.   Modes of Intervention:  Confrontation, Discussion, Education, Problem-solving, Socialization and Support  Summary of Progress/Problems: Today's Topic: Overcoming Obstacles. Patients identified one short term goal and potential obstacles in reaching this goal. Patients processed barriers involved in overcoming these obstacles. Patients identified steps necessary for overcoming these obstacles and explored motivation (internal and external) for facing these difficulties head on.   Everett Ricciardelli N Smart LCSW 06/22/2016, 11:32 AM

## 2016-06-22 NOTE — Progress Notes (Signed)
D: Patient is sleeping this morning.  She did wake up for her medications.  She appears disheveled and torn scrubs.  Patient requested some water.  She has a persistent cough with no production.  Her chest x-ray reveled possible pneumonia.  She reports passive SI with no specific plan.  She denies any withdrawal symptoms.  She reports good sleep; good appetite; normal energy and good concentration.  Her goal today is "to contact my lawyer and his paralegal for a loan."  Patient is not attending groups due to getting in late last night for admission.  She rates her depression and anxiety as a 7; her hopelessness as a 5.   A: Continue to monitor medication management and MD orders.  Safety checks completed every 15 minutes per protocol.  Offer support and encouragement as needed. R: Patient is receptive to staff; her behavior is appropriate.  She remains withdrawn and isolative to room.
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
## 2016-06-23 MED ORDER — NICOTINE 21 MG/24HR TD PT24
21.0000 mg | MEDICATED_PATCH | Freq: Every day | TRANSDERMAL | Status: DC
  Administered 2016-06-23 – 2016-06-24 (×2): 21 mg via TRANSDERMAL
  Filled 2016-06-23 (×5): qty 1

## 2016-06-23 NOTE — Progress Notes (Signed)
Recreation Therapy Notes  Animal-Assisted Activity (AAA) Program Checklist/Progress Notes Patient Eligibility Criteria Checklist & Daily Group note for Rec TxIntervention  Date: 03.06.2018 Time: 2:50pm [address]    AAA/T Program Assumption of Risk Form signed by Patient/ or Parent Legal Guardian Yes  Patient is free of allergies or sever asthma Yes  Patient reports no fear of animals Yes  Patient reports no history of cruelty to animals Yes  Patient understands his/her participation is voluntary Yes  Behavioral Response: Did not attend.   Marykay Lexenise L Jamone Garrido, LRT/CTRS        Vercie Pokorny L 06/23/2016 3:05 PM

## 2016-06-23 NOTE — [facility] Group Notes (Signed)
[facility] LCSW Group Therapy  06/23/2016 2:41 PM  Type of Therapy:  Group Therapy  Participation Level:  Did Not Attend-pt invited. Chose to remain in room. Pt observed on phone several times this afternoon in hallway.   Summary of Progress/Problems: [facility] Speaker came to talk about his personal journey with substance abuse and addiction. The pt processed ways by which to relate to the speaker. MHA speaker provided handouts and educational information pertaining to groups and services offered by [facility].   Mat Stuard N Smart LCSW 06/23/2016, 2:41 PM

## 2016-06-23 NOTE — Progress Notes (Signed)
[facility] MD Progress Note  06/23/2016 2:00 PM Barbara Werner  MRN:  [id number] Subjective:  Patient is a 36 YO female admitted with suicidal ideation, depression, anxiety Principal Problem: depression, anxiety, substance abuse Diagnosis:   Patient Active Problem List   Diagnosis Date Noted  . MDD (major depressive disorder), recurrent severe, without psychosis ([facility]) [F33.2] 06/21/2016  . Bipolar 1 disorder, depressed, severe ([facility]) [F31.4] 06/21/2016   Total Time spent with patient: 20 minutes  Past Psychiatric History: patient is admitted with suicidal ideation, depression, anxiety  Past Medical History: History reviewed. No pertinent past medical history. History reviewed. No pertinent surgical history. Family History: History reviewed. No pertinent family history. Family Psychiatric  History: see H&P Social History:  History  Alcohol use Not on file     History  Drug Use No    Social History   Social History  . Marital status: Single    Spouse name: N/A  . Number of children: N/A  . Years of education: N/A   Social History Main Topics  . Smoking status: Never Smoker  . Smokeless tobacco: Never Used  . Alcohol use None  . Drug use: No  . Sexual activity: Not Currently   Other Topics Concern  . None   Social History Narrative  . None   Additional Social History:    Pain Medications: none Prescriptions: none Over the Counter: none History of alcohol / drug use?: No history of alcohol / drug abuse                    Sleep: Good  Appetite:  Good  Current Medications: Current Facility-Administered Medications  Medication Dose Route Frequency Provider Last Rate Last Dose  . acetaminophen (TYLENOL) tablet 650 mg  650 mg Oral Q6H PRN Laveda Abbe, NP   650 mg at 06/23/16 1103  . alum & mag hydroxide-simeth (MAALOX/MYLANTA) 200-200-20 MG/5ML suspension 30 mL  30 mL Oral Q4H PRN Laveda Abbe, NP      . citalopram (CELEXA) tablet 20 mg  20 mg Oral  Daily Laveda Abbe, NP   20 mg at 06/23/16 0755  . hydrOXYzine (ATARAX/VISTARIL) tablet 50 mg  50 mg Oral TID PRN Laveda Abbe, NP      . ibuprofen (ADVIL,MOTRIN) tablet 800 mg  800 mg Oral Q8H PRN Laveda Abbe, NP   800 mg at 06/23/16 0755  . levofloxacin (LEVAQUIN) tablet 750 mg  750 mg Oral Daily Laveda Abbe, NP   750 mg at 06/23/16 0755  . loperamide (IMODIUM) capsule 2-4 mg  2-4 mg Oral PRN Laveda Abbe, NP      . LORazepam (ATIVAN) tablet 1 mg  1 mg Oral Q6H PRN Laveda Abbe, NP      . LORazepam (ATIVAN) tablet 1 mg  1 mg Oral BID Laveda Abbe, NP       Followed by  . [START ON 06/25/2016] LORazepam (ATIVAN) tablet 1 mg  1 mg Oral Daily Laveda Abbe, NP      . magnesium hydroxide (MILK OF MAGNESIA) suspension 30 mL  30 mL Oral Daily PRN Laveda Abbe, NP      . metoprolol succinate (TOPROL-XL) 24 hr tablet 25 mg  25 mg Oral Daily Antonieta Pert, MD   25 mg at 06/23/16 0756  . multivitamin with minerals tablet 1 tablet  1 tablet Oral Daily Laveda Abbe, NP   1 tablet at 06/23/16 0755  . nicotine (  NICODERM CQ - dosed in mg/24 hours) patch 21 mg  21 mg Transdermal Daily Greg Lawson Clary, MD   21 mg at 06/23/16 1208  . ondansetron (ZOFRAN-ODT) disintegratiAntonieta Pertng tablet 4 mg  4 mg Oral Q6H PRN Laveda AbbeLaurie Britton Parks, NP      . QUEtiapine (SEROQUEL) tablet 50 mg  50 mg Oral QHS Laveda AbbeLaurie Britton Parks, NP   50 mg at 06/22/16 2030  . thiamine (B-1) injection 100 mg  100 mg Intramuscular Once Laveda AbbeLaurie Britton Parks, NP      . thiamine (VITAMIN B-1) tablet 100 mg  100 mg Oral Daily Laveda AbbeLaurie Britton Parks, NP   100 mg at 06/23/16 0755  . traZODone (DESYREL) tablet 50 mg  50 mg Oral QHS Laveda AbbeLaurie Britton Parks, NP   50 mg at 06/22/16 2031    Lab Results: No results found for this or any previous visit (from the past 48 hour(s)).  Blood Alcohol level:  Lab Results  Component Value Date   ETH 137 (H) 06/21/2016    Metabolic  Disorder Labs: No results found for: HGBA1C, MPG No results found for: PROLACTIN No results found for: CHOL, TRIG, HDL, CHOLHDL, VLDL, LDLCALC  Physical Findings: AIMS: Facial and Oral Movements Muscles of Facial Expression: None, normal Lips and Perioral Area: None, normal Jaw: None, normal Tongue: None, normal,Extremity Movements Upper (arms, wrists, hands, fingers): None, normal Lower (legs, knees, ankles, toes): None, normal, Trunk Movements Neck, shoulders, hips: None, normal, Overall Severity Severity of abnormal movements (highest score from questions above): None, normal Incapacitation due to abnormal movements: None, normal, Dental Status Current problems with teeth and/or dentures?: No Does patient usually wear dentures?: No  CIWA:  CIWA-Ar Total: 3 COWS:     Musculoskeletal: Strength & Muscle Tone: within normal limits Gait & Station: normal Patient leans: N/A  Psychiatric Specialty Exam: Physical Exam  ROS  Blood pressure 118/78, pulse 89, temperature 98.8 F (37.1 C), temperature source Oral, resp. rate 18, height 5\' 8"  (1.727 m), weight 90.7 kg (200 lb).Body mass index is 30.41 kg/m.  General Appearance: Casual  Eye Contact:  Fair  Speech:  Clear and Coherent  Volume:  Normal  Mood:  Euthymic  Affect:  Congruent  Thought Process:  Coherent  Orientation:  Full (Time, Place, and Person)  Thought Content:  Logical  Suicidal Thoughts:  No  Homicidal Thoughts:  No  Memory:  Immediate;   Fair  Judgement:  Impaired  Insight:  Lacking  Psychomotor Activity:  Normal  Concentration:  Concentration: Fair  Recall:  [employer]  Fund of Knowledge:  Fair  Language:  Good  Akathisia:  No  Handed:  Right  AIMS (if indicated):     Assets:  Desire for Improvement  ADL's:  Intact  Cognition:  WNL  Sleep:  Number of Hours: 6     Treatment Plan Summary: Daily contact with patient to assess and evaluate symptoms and progress in treatment, Medication management and Plan  1) patient is taking medications, spends most of the day in bed, not attending groups. Patient denies any current SI. We will plan on discharge tomorrow.  Antonieta PertGreg Lawson Clary, MD 06/23/2016, 2:00 PM

## 2016-06-23 NOTE — Progress Notes (Signed)
D: Patient is visible on the unit today, which is an improvement from yesterday.  She does not attend any groups and is observed on the telephone numerous times.  She rates her depression, hopelessness and anxiety as a 7.  She repeatedly requests tylenol and ibuprofen for various aches.  She reports good sleep, good appetite; normal energy and good concentration.  Patient denies any withdrawal symptoms and continues on the ativan protocol.  Patient presents with flat affect and somewhat irritable and depressed mood.  She denies any thoughts of self harm. Patient is a a possible discharge for tomorrow. A: Continue to monitor medication management and MD orders.  Safety checks completed every 15 minutes per protocol.  Offer support and encouragement as needed. R: Patient is receptive to staff; her behavior is appropriate.

## 2016-06-23 NOTE — [facility] Group Notes (Signed)
[facility] Group Notes:  (Nursing/MHT/Case Management/Adjunct)  Date:  06/23/2016  Time:  0845 am  Type of Therapy:  Psychoeducational Skills  Participation Level:  Did Not Attend  Patient invited; declined to attend.  Cranford MonBeaudry, Lorina Duffner Evans 06/23/2016, 10:22 AM

## 2016-06-24 MED ORDER — TUBERCULIN PPD 5 UNIT/0.1ML ID SOLN
5.0000 [IU] | Freq: Once | INTRADERMAL | Status: DC
  Administered 2016-06-24: 5 [IU] via INTRADERMAL

## 2016-06-24 MED ORDER — CITALOPRAM HYDROBROMIDE 20 MG PO TABS: 20.0000 mg | ORAL_TABLET | Freq: Every day | ORAL | 0 refills | Status: AC

## 2016-06-24 MED ORDER — METOPROLOL SUCCINATE ER 25 MG PO TB24: 25.0000 mg | ORAL_TABLET | Freq: Every day | ORAL | 0 refills | Status: AC

## 2016-06-24 MED ORDER — PNEUMOCOCCAL VAC POLYVALENT 25 MCG/0.5ML IJ INJ
0.5000 mL | INJECTION | INTRAMUSCULAR | Status: AC
  Administered 2016-06-24: 0.5 mL via INTRAMUSCULAR

## 2016-06-24 MED ORDER — INFLUENZA VAC SPLIT QUAD 0.5 ML IM SUSY
0.5000 mL | PREFILLED_SYRINGE | INTRAMUSCULAR | Status: AC
Start: 2016-06-24 — End: 2016-06-24
  Administered 2016-06-24: 0.5 mL via INTRAMUSCULAR
  Filled 2016-06-24: qty 0.5

## 2016-06-24 MED ORDER — NICOTINE 21 MG/24HR TD PT24: 21.0000 mg | MEDICATED_PATCH | Freq: Every day | TRANSDERMAL | 0 refills | Status: AC

## 2016-06-24 MED ORDER — TRAZODONE HCL 50 MG PO TABS: 50.0000 mg | ORAL_TABLET | Freq: Every day | ORAL | 0 refills | Status: AC

## 2016-06-24 MED ORDER — QUETIAPINE FUMARATE 50 MG PO TABS: 50.0000 mg | ORAL_TABLET | Freq: Every day | ORAL | 0 refills | Status: AC

## 2016-06-24 MED ORDER — LEVOFLOXACIN 750 MG PO TABS: 750.0000 mg | ORAL_TABLET | Freq: Every day | ORAL | Status: AC

## 2016-06-24 MED ORDER — HYDROXYZINE HCL 50 MG PO TABS: 50.0000 mg | ORAL_TABLET | Freq: Three times a day (TID) | ORAL | 0 refills | Status: AC | PRN

## 2016-06-24 NOTE — Progress Notes (Signed)
Discharge note:  Patient discharged home per MD order.  Patient received all personal belongings from unit and locker.  Reviewed AVS/transition record with patient and she indicated understanding.  Patient denies any thoughts of self harm.  Patient received prescriptions and samples of her antibiotic.  She left ambulatory with her grandmother.
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
## 2016-06-24 NOTE — Tx Team (Signed)
Interdisciplinary Treatment and Diagnostic Plan Update  06/24/2016 Time of Session: 0930 Barbara Werner MRN: [id number]  Principal Diagnosis: Bipolar Disorder I Secondary Diagnoses: Active Problems:   Bipolar 1 disorder, depressed, severe (HCC)   Current Medications:  Current Facility-Administered Medications  Medication Dose Route Frequency Provider Last Rate Last Dose  . acetaminophen (TYLENOL) tablet 650 mg  650 mg Oral Q6H PRN Ethelene Hal, NP   650 mg at 06/23/16 1103  . alum & mag hydroxide-simeth (MAALOX/MYLANTA) 200-200-20 MG/5ML suspension 30 mL  30 mL Oral Q4H PRN Ethelene Hal, NP      . citalopram (CELEXA) tablet 20 mg  20 mg Oral Daily Ethelene Hal, NP   20 mg at 06/24/16 0754  . hydrOXYzine (ATARAX/VISTARIL) tablet 50 mg  50 mg Oral TID PRN Ethelene Hal, NP   50 mg at 06/24/16 0903  . ibuprofen (ADVIL,MOTRIN) tablet 800 mg  800 mg Oral Q8H PRN Ethelene Hal, NP   800 mg at 06/24/16 6712  . levofloxacin (LEVAQUIN) tablet 750 mg  750 mg Oral Daily Ethelene Hal, NP   750 mg at 06/24/16 0754  . loperamide (IMODIUM) capsule 2-4 mg  2-4 mg Oral PRN Ethelene Hal, NP      . LORazepam (ATIVAN) tablet 1 mg  1 mg Oral Q6H PRN Ethelene Hal, NP      . LORazepam (ATIVAN) tablet 1 mg  1 mg Oral BID Ethelene Hal, NP   1 mg at 06/24/16 0754   Followed by  . [START ON 06/25/2016] LORazepam (ATIVAN) tablet 1 mg  1 mg Oral Daily Ethelene Hal, NP      . magnesium hydroxide (MILK OF MAGNESIA) suspension 30 mL  30 mL Oral Daily PRN Ethelene Hal, NP      . metoprolol succinate (TOPROL-XL) 24 hr tablet 25 mg  25 mg Oral Daily Sharma Covert, MD   25 mg at 06/24/16 0754  . multivitamin with minerals tablet 1 tablet  1 tablet Oral Daily Ethelene Hal, NP   1 tablet at 06/24/16 0754  . nicotine (NICODERM CQ - dosed in mg/24 hours) patch 21 mg  21 mg Transdermal Daily Sharma Covert, MD   21 mg at 06/24/16 0758   . ondansetron (ZOFRAN-ODT) disintegrating tablet 4 mg  4 mg Oral Q6H PRN Ethelene Hal, NP      . QUEtiapine (SEROQUEL) tablet 50 mg  50 mg Oral QHS Ethelene Hal, NP   50 mg at 06/23/16 2129  . thiamine (B-1) injection 100 mg  100 mg Intramuscular Once Ethelene Hal, NP      . thiamine (VITAMIN B-1) tablet 100 mg  100 mg Oral Daily Ethelene Hal, NP   100 mg at 06/24/16 0755  . traZODone (DESYREL) tablet 50 mg  50 mg Oral QHS Ethelene Hal, NP   50 mg at 06/23/16 2129   PTA Medications: Prescriptions Prior to Admission  Medication Sig Dispense Refill Last Dose  . citalopram (CELEXA) 20 MG tablet Take 20 mg by mouth daily.   Unknown at Unknown time  . hydrOXYzine (VISTARIL) 50 MG capsule Take 50 mg by mouth 3 (three) times daily as needed for anxiety.   Unknown at Unknown time  . QUEtiapine (SEROQUEL) 50 MG tablet Take 50 mg by mouth at bedtime.   Unknown at Unknown time  . traZODone (DESYREL) 50 MG tablet Take 50 mg by mouth at bedtime.   Unknown  at Unknown time    Patient Stressors: Financial difficulties Marital or family conflict  Patient Strengths: Average or above average intelligence General fund of knowledge  Treatment Modalities: Medication Management, Group therapy, Case management,  1 to 1 session with clinician, Psychoeducation, Recreational therapy.   Physician Treatment Plan for Primary Diagnosis:  Bipolar Disorder I Long Term Goal(s): Improvement in symptoms so as ready for discharge Improvement in symptoms so as ready for discharge   Short Term Goals: Ability to identify changes in lifestyle to reduce recurrence of condition will improve Ability to verbalize feelings will improve Ability to disclose and discuss suicidal ideas Ability to demonstrate self-control will improve Ability to identify and develop effective coping behaviors will improve Ability to maintain clinical measurements within normal limits will improve Compliance  with prescribed medications will improve Ability to identify triggers associated with substance abuse/mental health issues will improve Ability to identify changes in lifestyle to reduce recurrence of condition will improve Ability to verbalize feelings will improve Ability to disclose and discuss suicidal ideas Ability to demonstrate self-control will improve Ability to identify and develop effective coping behaviors will improve Ability to maintain clinical measurements within normal limits will improve Compliance with prescribed medications will improve Ability to identify triggers associated with substance abuse/mental health issues will improve  Medication Management: Evaluate patient's response, side effects, and tolerance of medication regimen.  Therapeutic Interventions: 1 to 1 sessions, Unit Group sessions and Medication administration.  Evaluation of Outcomes: Adequate for discharge   Physician Treatment Plan for Secondary Diagnosis: Active Problems:   Bipolar 1 disorder, depressed, severe ([place])  Long Term Goal(s): Improvement in symptoms so as ready for discharge Improvement in symptoms so as ready for discharge   Short Term Goals: Ability to identify changes in lifestyle to reduce recurrence of condition will improve Ability to verbalize feelings will improve Ability to disclose and discuss suicidal ideas Ability to demonstrate self-control will improve Ability to identify and develop effective coping behaviors will improve Ability to maintain clinical measurements within normal limits will improve Compliance with prescribed medications will improve Ability to identify triggers associated with substance abuse/mental health issues will improve Ability to identify changes in lifestyle to reduce recurrence of condition will improve Ability to verbalize feelings will improve Ability to disclose and discuss suicidal ideas Ability to demonstrate self-control will  improve Ability to identify and develop effective coping behaviors will improve Ability to maintain clinical measurements within normal limits will improve Compliance with prescribed medications will improve Ability to identify triggers associated with substance abuse/mental health issues will improve     Medication Management: Evaluate patient's response, side effects, and tolerance of medication regimen.  Therapeutic Interventions: 1 to 1 sessions, Unit Group sessions and Medication administration.  Evaluation of Outcomes: Met   RN Treatment Plan for Primary Diagnosis:  Bipolar Disorder I Long Term Goal(s): Knowledge of disease and therapeutic regimen to maintain health will improve  Short Term Goals: Ability to remain free from injury will improve, Ability to disclose and discuss suicidal ideas and Ability to identify and develop effective coping behaviors will improve  Medication Management: RN will administer medications as ordered by provider, will assess and evaluate patient's response and provide education to patient for prescribed medication. RN will report any adverse and/or side effects to prescribing provider.  Therapeutic Interventions: 1 on 1 counseling sessions, Psychoeducation, Medication administration, Evaluate responses to treatment, Monitor vital signs and CBGs as ordered, Perform/monitor CIWA, COWS, AIMS and Fall Risk screenings as ordered, Perform wound care  treatments as ordered.  Evaluation of Outcomes: Met   LCSW Treatment Plan for Primary Diagnosis: Bipolar Disorder I Long Term Goal(s): Safe transition to appropriate next level of care at discharge, Engage patient in therapeutic group addressing interpersonal concerns.  Short Term Goals: Engage patient in aftercare planning with referrals and resources, Facilitate patient progression through stages of change regarding substance use diagnoses and concerns and Identify triggers associated with mental  health/substance abuse issues  Therapeutic Interventions: Assess for all discharge needs, 1 to 1 time with Social worker, Explore available resources and support systems, Assess for adequacy in community support network, Educate family and significant other(s) on suicide prevention, Complete Psychosocial Assessment, Interpersonal group therapy.  Evaluation of Outcomes: Adequate for discharge   Progress in Treatment: Attending groups: No. Pt does not attend group.  Participating in groups: No.  Taking medication as prescribed: Yes. Toleration medication: Yes. Family/Significant other contact made: SPE completed with pt; pt declined to consent to family contact.  Patient understands diagnosis: Yes. Discussing patient identified problems/goals with staff: No. Medical problems stabilized or resolved: No. Denies suicidal/homicidal ideation: Yes. Issues/concerns per patient self-inventory: No. Other: n/a  New problem(s) identified: No, Describe:  n/a  New Short Term/Long Term Goal(s): detox; medication stabilization; development of comprehensive mental wellness/sobriety plan.   Discharge Plan or Barriers: Pt plans to stay with grandmother; follow-up with [facility]. Pt plans to seek out her case worker at discharge; pt did not provide CSW with her contact information while inpatient as requested.   Reason for Continuation of Hospitalization: none  Estimated Length of Stay: discharge today   Attendees: Patient: 06/24/2016 9:30 AM  Physician: Dr. Mallie Darting MD 06/24/2016 9:30 AM  Nursing: Sherrine Maples RN 06/24/2016 9:30 AM  RN Care Manager: Lars Pinks CM 06/24/2016 9:30 AM  Social Worker: Maxie Better, LCSW; Matthew Saras LCSW 06/24/2016 9:30 AM  Recreational Therapist: x 06/24/2016 9:30 AM  Other: Samuel Jester NP; Lindell Spar NP 06/24/2016 9:30 AM  Other:  06/24/2016 9:30 AM  Other: 06/24/2016 9:30 AM    Scribe for Treatment Team: [place], LCSW 06/24/2016 9:30 AM

## 2016-06-24 NOTE — Progress Notes (Signed)
Nursing Progress Note: 7p-7a D: Pt currently presents with a flat/depressed/irritable affect and behavior. Pt states "all I want to do is catch up on sleep. I don't care about anything else." Not interacting with milieu. Pt reports good sleep with current medication regimen.   A: Pt provided with medications per providers orders. Pt's labs and vitals were monitored throughout the night. Pt supported emotionally and encouraged to express concerns and questions. Pt educated on medications.  R: Pt's safety ensured with 15 minute and environmental checks. Pt currently denies SI/HI/Self Harm and AVH. Pt verbally contracts to seek staff if SI/HI or A/VH occurs and to consult with staff before acting on any harmful thoughts. Will continue to monitor.

## 2016-06-24 NOTE — Progress Notes (Signed)
Recreation Therapy Notes  Date: 06/24/16 Time: 0930 Location: 300 [address] Group Room  Group Topic: Stress Management  Goal Area(s) Addresses:  Patient will verbalize importance of using healthy stress management.  Patient will identify positive emotions associated with healthy stress management.   Intervention: Stress Management  Activity :  Mindfulness Meditation.  LRT introduced the stress management technique of mindfulness meditation.  LRT played a meditation from the Calm app to allow patients to participate in mindfulness.  Patients were to follow along as the meditation played.   Education:  Stress Management, Discharge Planning.   Education Outcome: Acknowledges edcuation/In group clarification offered/Needs additional education  Clinical Observations/Feedback: Pt did not attend group.   Caroll RancherMarjette Rodel Glaspy, LRT/CTRS         Lillia AbedLindsay, Dak Szumski A 06/24/2016 11:35 AM

## 2016-06-24 NOTE — Progress Notes (Signed)
  [facility] Adult Case Management Discharge Plan :  Will you be returning to the same living situation after discharge:  Yes,  living with grandma At discharge, do you have transportation home?: Yes,  grandmother Do you have the ability to pay for your medications: Yes,  [place] Medicaid  Release of information consent forms completed and  Submitted to medical records by CSW.  Patient to Follow up at: Follow-up Information    [facility] Follow up.   Specialty:  Behavioral Health Why:  Walk in between 8am-9am Monday through Friday for hospital follow-up/medication management/assessment for counseling services. Please walk in to be seen within 7 days of hospital discharge. Thank you.  Contact information: [address] [place] [address] [phone]           Next level of care provider has access to [facility] Link:no  Safety Planning and Suicide Prevention discussed: Yes,  SPE completed with pt; pt declined to consent to family contact. SPI pamphlet and Mobile Crisis information provided to pt and she was encouraged to share information with support network.   Have you used any form of tobacco in the last 30 days? (Cigarettes, Smokeless Tobacco, Cigars, and/or Pipes): No  Has patient been referred to the Quitline?: N/A patient is not a smoker  Patient has been referred for addiction treatment: Yes  Ledell PeoplesHeather N Smart LCSW 06/24/2016, 9:29 AM

## 2016-06-24 NOTE — [facility] Suicide Risk Assessment (Signed)
[facility]   Principal Problem: depression, anxiety, substance abuse Discharge Diagnoses:  Patient Active Problem List   Diagnosis Date Noted  . MDD (major depressive disorder), recurrent severe, without psychosis (HCC) [F33.2] 06/21/2016  . Bipolar 1 disorder, depressed, severe (HCC) [F31.4] 06/21/2016    Total Time spent with patient: 20 minutes  Musculoskeletal: Strength & Muscle Tone: within normal limits Gait & Station: normal Patient leans: N/A  Psychiatric Specialty Exam: ROS  Blood pressure (!) 158/62, pulse 96, temperature 98.4 F (36.9 C), temperature source Oral, resp. rate 16, height 5\' 8"  (1.727 m), weight 90.7 kg (200 lb).Body mass index is 30.41 kg/m.  General Appearance: Casual  Eye Contact::  Good  Speech:  Clear and Coherent409  Volume:  Normal  Mood:  Euthymic  Affect:  Appropriate  Thought Process:  Coherent  Orientation:  Full (Time, Place, and Person)  Thought Content:  Logical  Suicidal Thoughts:  No  Homicidal Thoughts:  No  Memory:  Immediate;   Good  Judgement:  Intact  Insight:  Fair  Psychomotor Activity:  Normal  Concentration:  Good  Recall:  Good  Fund of Knowledge:Good  Language: Good  Akathisia:  No  Handed:  Right  AIMS (if indicated):     Assets:  Communication Skills Desire for Improvement Physical Health  Sleep:  Number of Hours: 4.5  Cognition: WNL  ADL's:  Intact   Mental Status Per Nursing Assessment::   On Admission:  Suicidal ideation indicated by patient  Demographic Factors:  Low socioeconomic status, Living alone and Unemployed  Loss Factors: Financial problems/change in socioeconomic status  Historical Factors: Impulsivity  Risk Reduction Factors:   Positive social support  Continued Clinical Symptoms:  Bipolar Disorder:   Depressive phase Alcohol/Substance Abuse/Dependencies  Cognitive Features That Contribute To Risk:  Thought constriction (tunnel vision)    Suicide  Risk:  Minimal: No identifiable suicidal ideation.  Patients presenting with no risk factors but with morbid ruminations; may be classified as minimal risk based on the severity of the depressive symptoms    Plan Of Care/Follow-up recommendations:  Activity:  ad lib  Antonieta PertGreg Lawson Shakoya Gilmore, MD 06/24/2016, 8:14 AM

## 2016-06-24 NOTE — Progress Notes (Signed)
Per pt request, [facility] house referral faxed Ned Cardattn Kathy to: [phone]. CSW asked MD to order TB test prior to pt's discharge today and pt instructed to have this read [facility]. Pt verbalized understanding of above. Clinicals faxed  Trula SladeHeather Smart, MSW, LCSW Clinical Social Worker 06/24/2016 10:26 AM

## 2016-06-24 NOTE — BHH Suicide Risk Assessment (Signed)
BHH INPATIENT:  Family/Significant Other Suicide Prevention Education  Suicide Prevention Education:  Patient Refusal for Family/Significant Other Suicide Prevention Education: The patient Barbara Werner has refused to provide written consent for family/significant other to be provided Family/Significant Other Suicide Prevention Education during admission and/or prior to discharge.  Physician notified.  SPE completed with pt, as pt refused to consent to family contact. SPI pamphlet provided to pt and pt was encouraged to share information with support network, ask questions, and talk about any concerns relating to SPE. Pt denies access to guns/firearms and verbalized understanding of information provided. Mobile Crisis information also provided to pt.   Brittian Renaldo N Smart LCSW 06/24/2016, 9:28 AM

## 2016-07-21 DIAGNOSIS — Z0000 Encounter for general adult medical examination without abnormal findings: Secondary | ICD-10-CM | POA: Diagnosis not present
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
## 2017-03-12 DIAGNOSIS — F3010 Manic episode without psychotic symptoms, unspecified: Secondary | ICD-10-CM | POA: Diagnosis not present

## 2017-03-12 DIAGNOSIS — F329 Major depressive disorder, single episode, unspecified: Secondary | ICD-10-CM | POA: Diagnosis not present
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
